# Patient Record
Sex: Male | Born: 1945 | Race: Black or African American | Hispanic: No | Marital: Married
Health system: Southern US, Community
[De-identification: ages and names within clinical notes are randomized; demographics above are authoritative.]

---

## 2013-09-24 ENCOUNTER — Ambulatory Visit: Payer: Self-pay | Admitting: Oncology

## 2013-10-18 ENCOUNTER — Inpatient Hospital Stay: Payer: Self-pay | Admitting: Internal Medicine

## 2013-10-18 ENCOUNTER — Ambulatory Visit: Payer: Self-pay | Admitting: Neurology

## 2013-10-18 LAB — URINALYSIS, COMPLETE
Leukocyte Esterase: NEGATIVE
Ph: 7 (ref 4.5–8.0)
RBC,UR: 11 /HPF (ref 0–5)
Specific Gravity: 1.019 (ref 1.003–1.030)
Squamous Epithelial: NONE SEEN
WBC UR: 1 /HPF (ref 0–5)

## 2013-10-18 LAB — COMPREHENSIVE METABOLIC PANEL
Albumin: 3.1 g/dL — ABNORMAL LOW (ref 3.4–5.0)
Alkaline Phosphatase: 52 U/L
BUN: 16 mg/dL (ref 7–18)
Chloride: 104 mmol/L (ref 98–107)
Co2: 25 mmol/L (ref 21–32)
Creatinine: 1.22 mg/dL (ref 0.60–1.30)
EGFR (African American): >60
EGFR (Non-African Amer.): >60
Osmolality: 272 (ref 275–301)
Potassium: 4 mmol/L (ref 3.5–5.1)
SGOT(AST): 25 U/L (ref 15–37)
SGPT (ALT): 20 U/L (ref 12–78)
Sodium: 134 mmol/L — ABNORMAL LOW (ref 136–145)
Total Protein: 8.4 g/dL — ABNORMAL HIGH (ref 6.4–8.2)

## 2013-10-18 LAB — CBC WITH DIFFERENTIAL/PLATELET
Basophil #: 0 10*3/uL (ref 0.0–0.1)
Eosinophil #: 0 10*3/uL (ref 0.0–0.7)
Eosinophil %: 0.4 %
HCT: 16.2 % — ABNORMAL LOW (ref 40.0–52.0)
Lymphocyte #: 1.4 10*3/uL (ref 1.0–3.6)
MCH: 37.6 pg — ABNORMAL HIGH (ref 26.0–34.0)
Monocyte #: 0.1 x10 3/mm — ABNORMAL LOW (ref 0.2–1.0)
Neutrophil #: 0.9 10*3/uL — ABNORMAL LOW (ref 1.4–6.5)
Platelet: 40 10*3/uL — ABNORMAL LOW (ref 150–440)

## 2013-10-18 LAB — FOLATE: Folic Acid: 16.8 ng/mL (ref 3.1–100.0)

## 2013-10-18 LAB — TROPONIN I: Troponin-I: <0.02 ng/mL

## 2013-10-19 LAB — CBC WITH DIFFERENTIAL/PLATELET
Basophil %: 0.6 %
Eosinophil #: 0 10*3/uL (ref 0.0–0.7)
Eosinophil %: 0.6 %
HCT: 19.7 % — ABNORMAL LOW (ref 40.0–52.0)
HGB: 6.9 g/dL — ABNORMAL LOW (ref 13.0–18.0)
Lymphocyte #: 1.1 10*3/uL (ref 1.0–3.6)
Lymphocytes: 52 %
MCH: 35.4 pg — ABNORMAL HIGH (ref 26.0–34.0)
MCHC: 34.9 g/dL (ref 32.0–36.0)
Metamyelocyte: 1 %
Monocyte %: 7.2 %
Monocytes: 7 %
Myelocyte: 1 %
NRBC/100 WBC: 2 /
Neutrophil %: 36 %
Other Cells Blood: 5
Platelet: 33 10*3/uL — ABNORMAL LOW (ref 150–440)
RDW: 21.6 % — ABNORMAL HIGH (ref 11.5–14.5)
WBC: 1.9 10*3/uL — CL (ref 3.8–10.6)

## 2013-10-19 LAB — BASIC METABOLIC PANEL
BUN: 13 mg/dL (ref 7–18)
Calcium, Total: 8.7 mg/dL (ref 8.5–10.1)
Co2: 28 mmol/L (ref 21–32)
Glucose: 110 mg/dL — ABNORMAL HIGH (ref 65–99)
Osmolality: 275 (ref 275–301)
Sodium: 137 mmol/L (ref 136–145)

## 2013-10-20 LAB — CBC WITH DIFFERENTIAL/PLATELET
Comment - H1-Com4: NORMAL
Eosinophil: 2 %
Lymphocytes: 70 %
Platelet: 35 10*3/uL — ABNORMAL LOW (ref 150–440)
RBC: 2.51 10*6/uL — ABNORMAL LOW (ref 4.40–5.90)
RDW: 23.8 % — ABNORMAL HIGH (ref 11.5–14.5)

## 2013-10-20 LAB — BASIC METABOLIC PANEL
Anion Gap: 4 — ABNORMAL LOW (ref 7–16)
BUN: 15 mg/dL (ref 7–18)
Calcium, Total: 8.6 mg/dL (ref 8.5–10.1)
Chloride: 101 mmol/L (ref 98–107)
Co2: 28 mmol/L (ref 21–32)
EGFR (African American): >60
EGFR (Non-African Amer.): 59 — ABNORMAL LOW
Osmolality: 268 (ref 275–301)
Sodium: 133 mmol/L — ABNORMAL LOW (ref 136–145)

## 2013-10-20 LAB — DIFFERENTIAL
Basophil #: 0 10*3/uL (ref 0.0–0.1)
Basophil %: 0.3 %
Eosinophil %: 0.3 %
Lymphocyte #: 1.6 10*3/uL (ref 1.0–3.6)
Lymphocyte %: 59.1 %
Monocyte #: 0.2 x10 3/mm (ref 0.2–1.0)
Neutrophil %: 33.9 %

## 2013-11-25 ENCOUNTER — Ambulatory Visit: Payer: Self-pay | Admitting: Oncology

## 2015-02-14 NOTE — Consult Note (Signed)
Preliminary results of BMBx reveal a highly malingant sample, suggesting acute leukemia. Patient should be transferred to a [facility] for further evaluation and treatment.  Patient is from [place] and may consider returning home prior to treatment.  Please page Dr. Orlie DakinFinnegan with any questions.    Electronic Signatures: Gerarda FractionFinnegan, Timothy (MD)  (Signed on 26-Dec-14 17:23)  Authored  Last Updated: 26-Dec-14 17:23 by Gerarda FractionFinnegan, Timothy (MD)

## 2015-02-14 NOTE — Consult Note (Signed)
History of Present Illness:  Reason for Consult Pancytopenia.   HPI   Patient is a 69 year old male who presented to the hospital complaining of acute onset dizziness.  He also is complaining of significant nausea.  During his workup, patient was noted to be severely pancytopenic.  Currently, his only complaint is of ear pain.  He denies any fevers, chills, or night sweats.  He has a good appetite and denies weight loss.  He denies any chest pain or shortness of breath.  He denies any easy bleeding or bruising.  He denies any nausea, vomiting, constipation, or diarrhea.  He has no melena or hematochezia.  He has no urinary complaints.  Patient otherwise feels well and offers no further specific complaints.  PFSH:  Additional Past Medical and Surgical History Hypertension, gout  Family history: Prostate cancer, diabetes, heart disease.  Social history: Former tobacco, quit in the 1990s.  Occasional alcohol.  Patient currently visiting from [place].   Review of Systems:  Performance Status (ECOG) 0   Review of Systems   As per HPI. Otherwise, 10 point system review was negative.   NURSING NOTES: **Vital Signs.:   26-Dec-14 05:48   Vital Signs Type: Routine   Temperature Temperature (F): 98.7   Celsius: 37   Temperature Source: oral   Pulse Pulse: 81   Respirations Respirations: 18   Systolic BP Systolic BP: 416   Diastolic BP (mmHg) Diastolic BP (mmHg): 72   Mean BP: 90   Pulse Ox % Pulse Ox %: 94   Pulse Ox Activity Level: At rest   Oxygen Delivery: Room Air/ 21 %   Physical Exam:  Physical Exam General: Well-developed, well-nourished, no acute distress. Eyes: Pink conjunctiva, anicteric sclera. HEENT: Normocephalic, moist mucous membranes, clear oropharnyx. Lungs: Clear to auscultation bilaterally. Heart: Regular rate and rhythm. No rubs, murmurs, or gallops. Abdomen: Soft, nontender, nondistended. No organomegaly noted, normoactive bowel  sounds. Musculoskeletal: No edema, cyanosis, or clubbing. Neuro: Alert, answering all questions appropriately. Cranial nerves grossly intact. Skin: No rashes or petechiae noted. Psych: Normal affect. Lymphatics: No cervical, calvicular, axillary or inguinal LAD.    No Known Allergies:     allopurinol 300 mg oral tablet: 1 tab(s) orally once a day, As Needed for gout, Status: Active, Quantity: 0, Refills: None   telmisartan 80 mg oral tablet: 1 tab(s) orally once a day, Status: Active, Quantity: 0, Refills: None   colchicine 0.6 mg oral tablet: 1 tab(s) orally once a day, As Needed for gout flare-ups, Status: Active, Quantity: 0, Refills: None  Laboratory Results: Routine Chem:  26-Dec-14 05:53   Result Comment WBC - RESULTS VERIFIED BY REPEAT TESTING.  - NOTIFIED OF CRITICAL VALUE  - C/ VERNICIA GRAVES $RemoveBeforeD'@[id number]'NyELVjSEyyRxRv$  10-19-13. AJO  - READ-BACK PROCESS PERFORMED.  Result(s) reported on 19 Oct 2013 at 07:51AM.  Glucose, Serum  110  BUN 13  Creatinine (comp)  1.31  Sodium, Serum 137  Potassium, Serum 3.8  Chloride, Serum 105  CO2, Serum 28  Calcium (Total), Serum 8.7  Anion Gap  4  Osmolality (calc) 275  eGFR (African American) >60  eGFR (Non-African American)  57 (eGFR values <63mL/min/1.73 m2 may be an indication of chronic kidney disease (CKD). Calculated eGFR is useful in patients with stable renal function. The eGFR calculation will not be reliable in acutely ill patients when serum creatinine is changing rapidly. It is not useful in  patients on dialysis. The eGFR calculation may not be applicable to patients at the low and high  extremes of body sizes, pregnant women, and vegetarians.)  Routine Hem:  26-Dec-14 05:53   WBC (CBC)  1.9  RBC (CBC)  1.94  Hemoglobin (CBC)  6.9  Hematocrit (CBC)  19.7  Platelet Count (CBC)  33  MCV  101  MCH  35.4  MCHC 34.9  RDW  21.6  Neutrophil % 36.0  Lymphocyte % 55.6  Monocyte % 7.2  Eosinophil % 0.6  Basophil % 0.6  Neutrophil #   0.7  Lymphocyte # 1.1  Monocyte #  0.1  Eosinophil # 0.0  Basophil # 0.0   Assessment and Plan: Impression:   Pancytopenia. Plan:   1.  Pancytopenia: Unclear etiology, but given his neutropenia, severe anemia without bleeding, and thrombocytopenia this is concerning for an underlying bone marrow disorder.  Patient will have a bone marrow biopsy later today.  Typically takes 48-72 hours for results.  Continue to transfuse and maintain hemoglobin above 7.0.  He does not require platelet transfusion at this time, but would consider one if he falls below 20 or develops symptoms of bleeding.  Do not give Neulasta or Neupogen since these may actually worsen his underlying problem. Ear pain: Likely unrelated to his pancytopenia. consult, will follow.  Electronic Signatures: Delight Hoh (MD)  (Signed 26-Dec-14 11:07)  Authored: HISTORY OF PRESENT ILLNESS, PFSH, ROS, NURSING NOTES, PE, ALLERGIES, HOME MEDICATIONS, LABS, ASSESSMENT AND PLAN   Last Updated: 26-Dec-14 11:07 by Delight Hoh (MD)

## 2015-02-14 NOTE — Consult Note (Signed)
PATIENT NAME:  Philip Ibarra, Cope MR#:  [id number] DATE OF BIRTH:  10-Jun-1946  DATE OF CONSULTATION:  10/19/2013  REFERRING PHYSICIAN:  Dr. Cherlynn KaiserSainani CONSULTING PHYSICIAN:  Zackery BarefootJ. Madison Darvin Dials, MD  HISTORY OF PRESENT ILLNESS:  The patient is a 69 year old African American male who was traveling from [place] and presented to the Emergency Room yesterday. He was admitted with worsening dizziness. Notably, he was recently worked up for cardiac event after having a tooth pulled. He was referred to a pulmonologist for shortness of breath. Notably he underwent left ear surgery, but does not know the details other than "they pulled wax out." He will try to obtain details of that operation for us.   ALLERGIES:  None.   MEDICATIONS: 1.  Allopurinol. 2.  Colchicine.  3.  Telmisartan.   PAST MEDICAL HISTORY: 1.  Left ear surgery.  2.  Dental surgery.  3.  Recent cardiac catheterization.  4.  Hypertension.  5.  Gout.   PHYSICAL EXAMINATION:  GENERAL:  African American male in no acute distress. Cooperative.  EYES:  Pupils equal, round, reactive to light. Extraocular movements are intact. 3-beat nystagmus with rightward version of the eyes.  ORAL CAVITY AND OROPHARYNX:  No masses or lesions.  EARS:  The external auditory canals are clear. There is a yellow fullness in the tympanic membrane inferiorly on the left. It appears to be consistent with fluid or a fat graft.  NECK:  Trachea is midline. There are no masses or lesions.   IMPRESSION:  Dizziness, likely secondary to multifactorial issues, not consistent with exclusively peripheral vestibular dysfunction, although this likely contributes.   PLAN:  I have recommended the patient help arrange for details of his operation to be provided for us. I doubt that he was taken to surgery for cerumen impaction, and the ear was likely repaired with a fat graft for a tympanic membrane perforation, but I an unsure as to whether or not he had a cholesteatoma. The  patient does not recognize the word cholesteatoma. I have recommended that the patient continue his workup either here or at home with the pulmonologist and continued close followup with the ENT doctor back in [place]. I have recommended that the patient use nasal steroids to help open his eustachian tube as well as oral antibiotics as an outpatient.  ____________________________ Shela CommonsJ. Gertie BaronMadison Aayliah Rotenberry, MD jmc:jm D: 10/19/2013 14:15:16 ET T: 10/19/2013 14:38:38 ET JOB#: [id number]  cc: Zackery BarefootJ. Madison Mizuki Hoel, MD, <Dictator> [facility] - Practice Administrator Wendee CoppJMADISON Juanetta Negash MD ELECTRONICALLY SIGNED 10/20/2013 18:54

## 2015-02-15 NOTE — H&P (Signed)
PATIENT NAME:  Philip Ibarra, Philip Ibarra MR#:  [id number] DATE OF BIRTH:  1946-01-17  DATE OF ADMISSION:  10/18/2013  PRIMARY CARE PHYSICIAN: Located in [place].   CHIEF COMPLAINT: Dizziness.   HISTORY OF PRESENT ILLNESS: This is a 69 year old male who presents to the hospital due to acute dizziness that began this morning. The patient said that he had a tooth pulled a few weeks back in [place] and was started on antibiotics after the tooth being pulled. He then became somewhat short of breath and had a full cardiologic evaluation including a catheterization in [place]. He was then referred to a pulmonologist but has not seen one yet due to his shortness of breath. He is visiting from [place], and this morning he was increasingly dizzy and became quite nauseated and thought he had to vomit. He came to the ER for further evaluation.   The patient on workup was noted to be severely pancytopenic. Hospitalist services were contacted for further treatment and evaluation.   The patient denies any chest pain. Positive nausea. No vomiting. Denies any headache, any easy bruising or bleeding, any abdominal pain or any other associated symptoms presently.   REVIEW OF SYSTEMS: CONSTITUTIONAL: No documented fever. Positive weakness. No weight gain, no weight loss.  EYES: No blurred or double vision.  ENT: No tinnitus. No postnasal drip but no redness of the oropharynx. Positive vertigo.  RESPIRATORY: No cough. No wheeze. No hemoptysis. Positive dyspnea.  CARDIOVASCULAR: No chest pain. No orthopnea. No palpitations. No syncope.  GASTROINTESTINAL: Positive nausea. No vomiting. No diarrhea. No abdominal pain. No melena or hematochezia.  GENITOURINARY: No dysuria or hematuria.  ENDOCRINE: No polyuria, nocturia, heat or cold intolerance.  HEMATOLOGIC: Positive anemia. No acute bruising or bleeding.  INTEGUMENTARY: No rashes. No lesions.  MUSCULOSKELETAL: No arthritis. No swelling. No gout.  NEUROLOGIC: No numbness or  tingling. No ataxia. No seizure-type activity.  PSYCHIATRIC: No anxiety. No insomnia. No ADD.   PAST MEDICAL HISTORY: Consistent with hypertension and gout.   ALLERGIES: No known drug allergies.   SOCIAL HISTORY: Used to be a smoker, quit in 1990s. Occasional alcohol use. No illicit drug abuse. Lives at home with his wife.   FAMILY HISTORY: Both mother and father are deceased. Father died from prostate cancer. Mother had diabetes and heart disease.   CURRENT MEDICATIONS: Allopurinol 300 mg daily as needed, colchicine 0.6 mg daily as needed for gout attack, and telmisartan 80 mg daily.   PHYSICAL EXAMINATION:  VITAL SIGNS: Temperature 98.5, pulse 75, respirations 16, blood pressure 123/71, sats 98% on room air.  GENERAL: A pleasant-appearing male in no apparent distress.  HEENT: Atraumatic, normocephalic. Extraocular muscles are intact. Pupils equal and reactive to light. Sclerae anicteric. No conjunctival injection. No pharyngeal erythema.  NECK: Supple. There is no jugular venous distention. No bruits. No lymphadenopathy or thyromegaly.  HEART: Regular rate and rhythm. No murmurs. No rubs. No clicks.  LUNGS: Clear to auscultation bilaterally. No rales or rhonchi. No wheezes.  ABDOMEN: Soft, flat, nontender, nondistended. Has good bowel sounds. No hepatosplenomegaly appreciated.  EXTREMITIES: No evidence of any cyanosis, clubbing, or peripheral edema. Has +2 pedal and radial pulses bilaterally.  NEUROLOGICAL: Alert, awake, and oriented x3 with no focal motor or sensory deficits appreciated bilaterally.  SKIN: Moist and warm with no rashes appreciated. In fact, there is no cervical or axillary lymphadenopathy.   LABORATORY DATA: Serum glucose of 141, BUN 16, creatinine 1.2, sodium 134, potassium 4, chloride 104, bicarb 25. LFTs within normal limits. Troponin less than  0.02. White cell count of 2.4. Hemoglobin 5.5, hematocrit 16.2, platelet count 40,000. MCV is 110. RDW 18.4. Urinalysis is  within normal limits.   The patient did have a CT of the head done without contrast which showed decreased attenuation in the periphery of the right temporal lobe compared to the left. While this area of the brain,  he is prone to artifactual changes of this nature on the CT. The possibility of a recent infarct in this area cannot be excluded on the study. No other findings concerning for potential acute infarct is identified. There is age-related volume loss. No hemorrhage or mass effect.   ASSESSMENT AND PLAN: This is a 69 year old male with a history of hypertension, gout, who presents to the hospital with dizziness and vertigo and noted to be pancytopenic.   1.  Acute pancytopenia. The exact etiology of this is unclear. The patient has no history of acute bleeding or bruising, no history of previous anemia, is currently not on any anticoagulants. For now, we will continued supportive care with giving him 2 units of packed red blood cells. Follow serial hemoglobin and serial counts. He does not need any platelet transfusion at this time. I will get a hematology/oncology consult, discuss the case with Dr. Grayland Ormond. The patient likely would benefit from a bone marrow biopsy.  2.  Dizziness/vertigo. The exact etiology of this is also unclear. Questionable if this is related to anemia versus primary issue. On exam, the patient does have a left inner ear cystic lesion. I will continue supportive care and give him some meclizine and transfuse him 2 units of packed red blood cells and follow him clinically. I will also get an ENT consult.  3.  Hypertension. I will continue his telmisartan. 4.  Gout. The patient had no acute attack. I will continue his allopurinol and colchicine as needed.   CODE STATUS: FULL CODE.   TIME SPENT: 50 minutes.   ____________________________ Belia Heman. Verdell Carmine, MD vjs:np D: 10/18/2013 16:27:43 ET T: 10/18/2013 17:15:45 ET JOB#: [id number]  cc: Belia Heman. Verdell Carmine, MD,  <Dictator> Henreitta Leber MD ELECTRONICALLY SIGNED 11/21/2013 11:33

## 2015-02-15 NOTE — Discharge Summary (Signed)
PATIENT NAME:  Philip Ibarra, Philip Ibarra MR#:  [id number] DATE OF BIRTH:  [birth year]  DATE OF ADMISSION:  10/18/2013 DATE OF DISCHARGE:  10/20/2013  ADMITTING DIAGNOSIS:  Pancytopenia.  DISCHARGE DIAGNOSES:  1.  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  2.  Dyspnea, suspect sinusitis.  3.  History of hypertension. 4.  Gout.   DISCHARGE CONDITION: Stable.   DISCHARGE MEDICATIONS: The patient is to continue outpatient medications which are;   1.  Allopurinol 300 mg p.o. as needed 1 daily.  2.  Telmisartan 80 mg p.o. daily. 3.  Colchicine 0.6 mg p.o. daily as needed.  4.  Meclizine 25 mg p.o. 3 times daily as needed.  5.  Levaquin 500 mg p.o. daily for 10 more days.  6.  Combivent 100/20, 1 puff 4 times daily as needed.  7.  Home oxygen: None.   DIET: 2 grams salt, regular consistency.   ACTIVITY LIMITATIONS: As tolerated.   FOLLOWUP APPOINTMENT: With oncology as soon as possible in the next 1 to 2 days after discharge.    PATIENT'S PRIMARY CARE PHYSICIAN:  Unfortunately unknown.   CONSULTANTS:  Dr. Irish Elders, Dr. Carlis Abbott, Dr. Grayland Ormond, Dr. Inez Pilgrim.   RADIOLOGIC STUDIES: CT of head without contrast, 25h of December,  showing decrease attenuation in the peripheral of the right temporal lobe compared to the left while this area of the brain is prone to artifactual changes of the brain of this nature on CT. The possibility of recent infarct in this area cannot be excluded on this study. No other findings concerning for potential acute infarct was identified. There was also age-related volume loss. There was no hemorrhage or mass effect. 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 There is also left tympanic membrane thickening as well as atelectasis in this region on the left according to radiologist.   HISTORY OF PRESENT ILLNESS:  The patient is a 69 year old African-American male with past medical history significant for history of recent tooth problems who has had been on  antibiotic therapy up until recently, presented to the hospital with dizziness. Please refer to Dr. Edward Jolly admission note on the 25th of December, 2014. Apparently, the patient started having problems with acute dizziness which started on day of admission.  He also was complaining of shortness of breath. He already reported full  cardiologic evaluation, including catheterization in [place] recently, as well as antibiotic therapy for the tooth recently.  On arrival to the hospital, he was dizzy and nauseated and was found to be severely pancytopenic and was admitted by hospitalist for further evaluation. On arrival to the Emergency Room, the patient's temperature was 98.5, pulse was 75, respiration rate was 16, blood pressure 123/60, pulse is 71, O2 sat was s 98% on room air.   PHYSICAL EXAM: Was unremarkable.   LAB DATA: Revealed normal BMP, except for glucose 141, sodium 134, otherwise BMP was unremarkable. The patient's liver enzymes revealed elevation of total protein of 8.4, albumin level of 3.1, otherwise liver enzymes were normal. Troponin was less than 0.02. Blood cell count was low at 2.4, hemoglobin was 5.5 and platelet count was 40. MCV was high at 110, actual neutrophil count was low at 0.9. Differential was checked and was found to have 6 bands, 25% segmented neutrophils 52% lymphocytes, 2% of variant lymphocytes, 7 monocytes, 1 eosinophils, 1 metamyelocyte, 1 myelocyte, other cells were 5% and RBCs were 2%. Anisocytosis, as well as poikilocytosis, polychromasia and macrocytosis was present as well as toxic vacuolization.  Platelets were found to be variable in size.   HOSPITAL COURSE:  The patient was admitted to the hospital for further evaluation. Because of severe anemia, he was transfused 3 units of packed red blood cells after which hemoglobin level improved to 8.3 on the 27th of December, 2014. He platelet count remained stable at 35 and his white blood cell count also improved to 2.6.  The patient underwent bone marrow biopsy 26th of December 2014 which revealed a highly malignant samples suggesting acute leukemia. The patient was evaluated by oncologist, Dr. Grayland Ormond and Dr. Inez Pilgrim, who felt that the patient should be transferred to tertiary care center for further evaluation and treatment. Since patient was from [place], that was discussed with patient's family and they were eager to return back home to [place] and sign in to likely [facility]. The patient will be given a bone marrow biopsy samples to take with himself.  Since patient was neutropenic and was having mild elevation of temperature up to 99.5, he was given 2 grams of Cefepime on the 27th of December 2014. He was also asked to initiate antibiotic therapy with Levaquin tomorrow on the 28th of December, 2014, if for some reason he is not seen by oncologist earlier. On the day of discharge, 27th of December, 2014, his temperature is 99.5, pulse is 86, respiration rate was 18, blood pressure 372B to 021J systolic and 15Z and 20E diastolic, O2 sats were 02% on room air at rest. It was felt that patient's fever could have been related to sinusitis versus mild bronchitis, as the patient was having some cough and sinus drainage. He denies any dizziness. The patient is to continue meclizine. He was seen by Dr. Carlis Abbott, ENT physician, while he was in the hospital. It was felt that the patient's workup should include ENT physician back in [place]. It was recommended to continue nasal steroids, but since the patient was pancytopenic, we did not he is a good candidate to continue nasal steroids, however, Dr. Carlis Abbott felt that the patient nasal steroids would help him to open his eustachian tube. Dr. Carlis Abbott also recommended antibiotic therapy for this as well.  He was also seen by Dr. Irish Elders, neurologist, for dizziness. It was felt that it could have been benign positional vertigo or vestibulitis tinnitus, but less  likely Meniere's disease. He did not recommend having no further neurologic testing. For pancytopenia, as mentioned above, the patient was seen by oncologist and underwent bone marrow biopsy on 26th of December, 2014 which revealed a highly abnormal bone marrow concerning for acute leukemia. The patient was seen by Dr. Grayland Ormond as well as Dr. Inez Pilgrim who recommended transfer to tertiary care center as soon as possible and this patient's family requested discharge. The patient is being discharged home after he is given one dose of IV antibiotics prior to leaving the hospital.  On the day of discharge, temperature, as mentioned above, is 99.5, pulse was 86, respiratory rate was 18, blood pressure 154/80, saturation was 95% on room air at rest. For patient's chronic medical problems including hypertension as well as gout, the patient is to continue his outpatient management.   TIME SPENT: 40 minutes with the patient.   ____________________________ Theodoro Grist, MD rv:NTS D: 10/20/2013 18:28:35 ET T: 10/21/2013 05:54:33 ET JOB#: [id number]  cc: Theodoro Grist, MD, <Dictator> PRIMARY CARE PHYSICIAN  Jenaya Saar MD ELECTRONICALLY SIGNED 11/05/2013 14:31

## 2015-02-15 NOTE — Consult Note (Signed)
PATIENT NAME:  Philip Ibarra, Philip Ibarra MR#:  [id number] DATE OF BIRTH:  1946/07/10  DATE OF CONSULTATION:  10/18/2013  REFERRING PHYSICIAN:   CONSULTING PHYSICIAN:  Pauletta Browns, MD    REASON FOR CONSULTATION:  Rule out stroke.   HISTORY OF PRESENT ILLNESS:  This is a 69 year old male, who presents to the hospital with dizziness that began this morning. The patient is visiting from [place], states that he had increased episodes of dizziness associated with nausea and vomiting. Dizziness is associated with change of position, specifically moving from one side to the other. No chest pain. No headaches. This current dizziness is associated with about 2 to 3-week history of decreased hearing in the left ear.   REVIEW OF SYSTEMS:  CONSTITUTIONAL:  Generalized weakness, dizziness.  EYES:  No blurred vision.  ENT:  Positive tinnitus.  RESPIRATORY:  No cough. No wheezing.  CARDIOVASCULAR:  No chest pain.  GASTROINTESTINAL:  Positive for nausea.  GENITOURINARY:  No dysuria or hematuria. PSYCHIATRIC:  No anxiety. No depression.   PAST MEDICAL HISTORY:  Hypertension and gout.   ALLERGIES:  No known drug allergies.   SOCIAL HISTORY:  He used to be a smoker, quit in the '90s, occasional social use. No illicit drug use.   FAMILY HISTORY:  Both mother and father are deceased.   MEDICATIONS:  Include early allopurinol, colchicine, telmisartan.   BLOOD WORK:  Includes a glucose of 141, BUN 16, creatinine 1.22, sodium 134, potassium 4.0, chloride 104. White blood cell count is 2.4, hemoglobin is 5.5, platelet count is 40.   CT of the head done, which showed question of decreased attenuation, right temporal lobe, which I think is an artifact.   VITAL SIGNS:  Include a temperature of 97.5, pulse 68, respirations 16, blood pressure 104/57, pulse ox 98%, saturating well on room air.   PHYSICAL EXAMINATION:  The patient is alert, awake, oriented to place, time, location, and the reason he is in the hospital.  Extraocular movements intact. Pupils 4 mm to 3 mm, reactive bilaterally. Facial sensation intact. Tongue is midline. Uvula elevates symmetrically. Shoulder shrug intact. Motor strength 5/5 bilateral upper and lower extremities. Positive tinnitus and decreased hearing in the left ear, which is 2 to 3 weeks. Sensation is symmetrical throughout. Reflexes 1+ throughout. Coordination intact. Gait not assessed.   IMPRESSION:  A 68 year old male with a past medical history of hypertension and gout presented to hospital with dizziness and vertigo. On workup, the patient had a CAT scan of the head, which I believe has an incidental finding of questionable hypertensive right temporal lobe, which I believe is an artifact in nature. The patient was found to be pancytopenic with a hemoglobin 5.5, white blood cell count 2.4, platelet count of 40,000, elevated MCV of 110, RDW of 18.4. No focal neurological deficits at this time.   PLAN:   1.  I agree with evaluation by hematology/oncology for pancytopenia.  2.  Dizziness and vertigo. I think this is possibly related to either benign positional vertigo, as the patient has symptoms with change in positions, or vestibulitis due to tinnitus, less likely Meniere's disease. I agree with possibility of ENT consultation. The patient's current dizziness could be also contributing to his anemia. At this point, I would not recommend any further neurological testing.   This case was discussed with the patient and the patient's family at his bedside. Thank you. It was a pleasure seeing this patient.    ____________________________ Pauletta Browns, MD yz:ms D: 10/18/2013 20:13:48 ET  T: 10/18/2013 20:40:16 ET JOB#: [id number]  cc: Pauletta BrownsYuriy Jakeem Grape, MD, <Dictator> Pauletta BrownsYURIY Maevyn Riordan MD ELECTRONICALLY SIGNED 10/26/2013 14:50

## 2015-07-26 DEATH — deceased

## 2015-08-02 IMAGING — CT CT HEAD WITHOUT CONTRAST
1 series · 15 of 30 positions shown, 19 images · non-contrast
Comparison: None.

CLINICAL DATA: Dizziness with left ear pain; nausea and vomiting

EXAM:
CT HEAD WITHOUT CONTRAST
TECHNIQUE: Contiguous axial images were obtained from the base of the skull
through the vertex without intravenous contrast. Study was obtained
within 24 hr of patient's arrival at the emergency department.

[Series 2: head wo · axial · 0.42mm/px · z∈[-103,+41]mm · 15 of 36 slices shown, 19 images]
[im 2/36  brain]
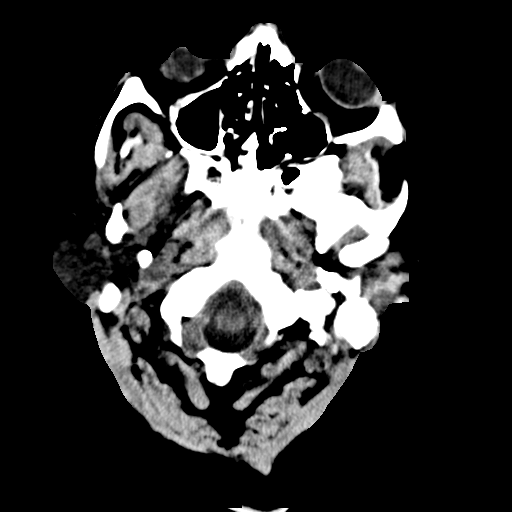
[im 2/36  bone]
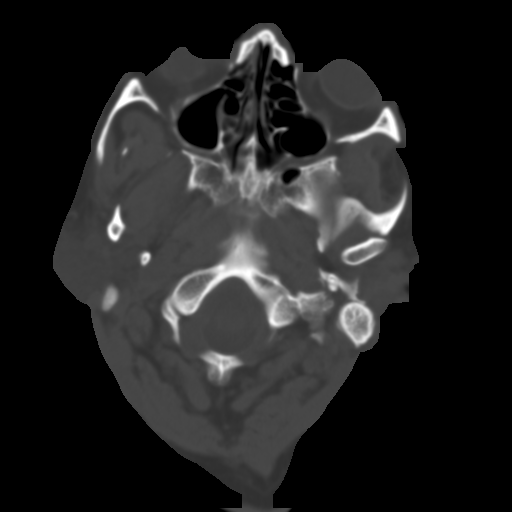
[im 4/36  brain]
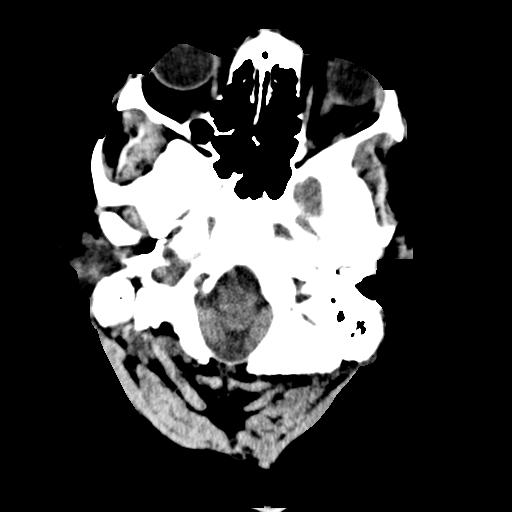
[im 7/36  brain]
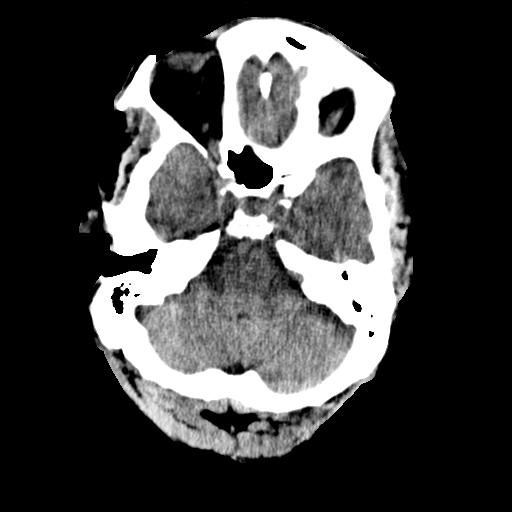
[im 9/36  brain]
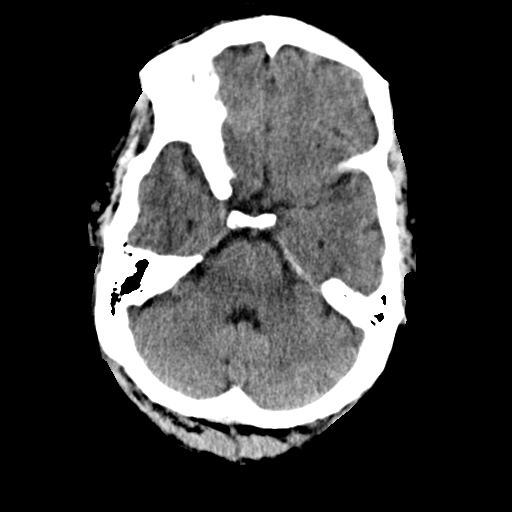
[im 11/36  brain]
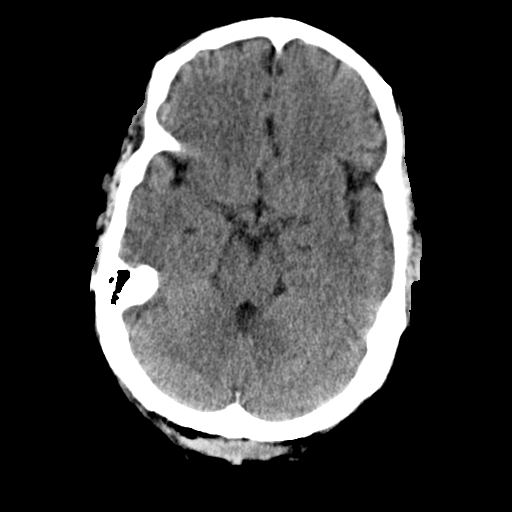
[im 11/36  bone]
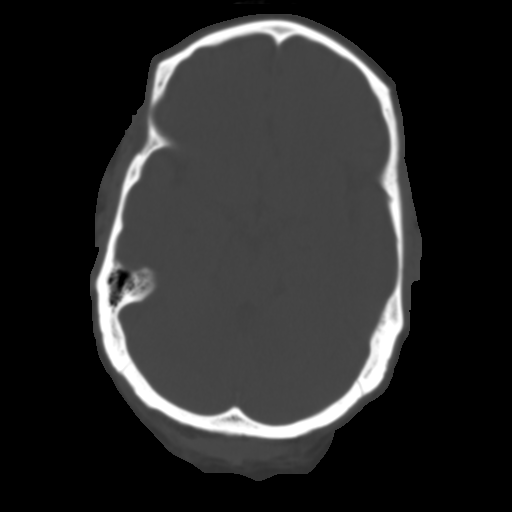
[im 14/36  brain]
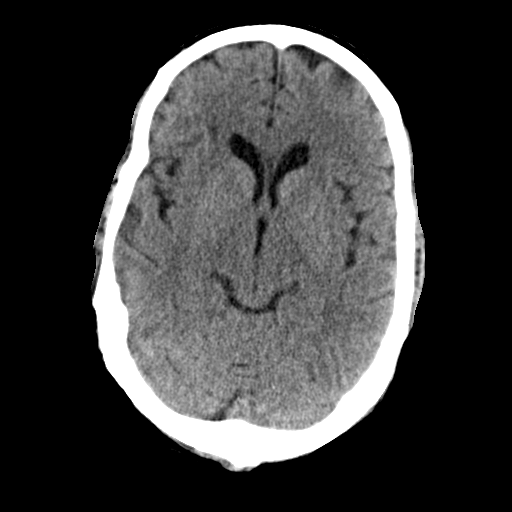
[im 16/36  brain]
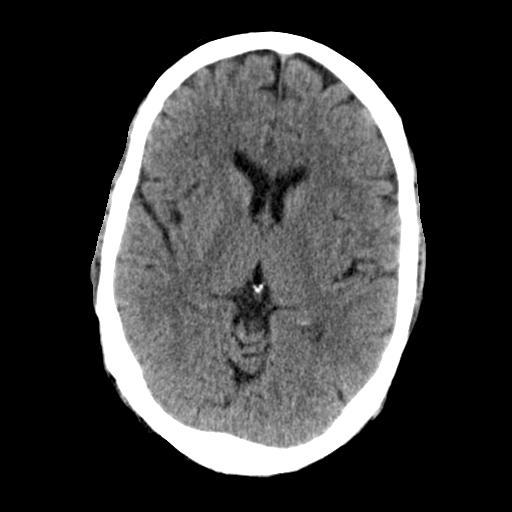
[im 19/36  brain]
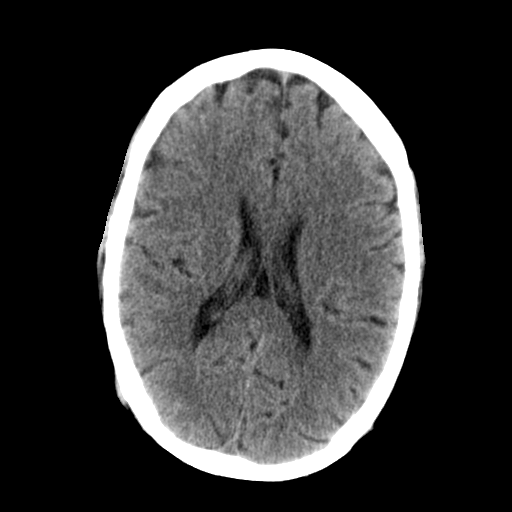
[im 20/36  brain]
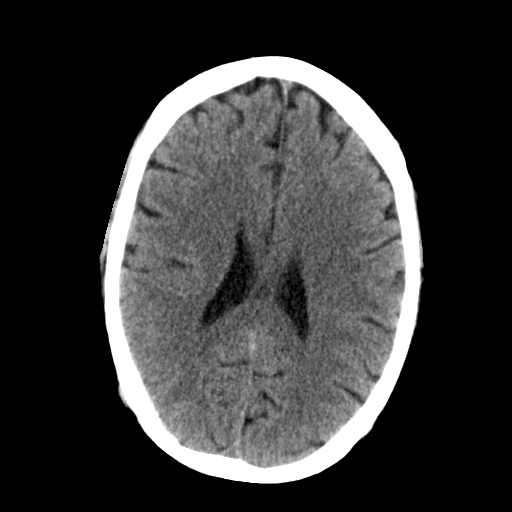
[im 20/36  bone]
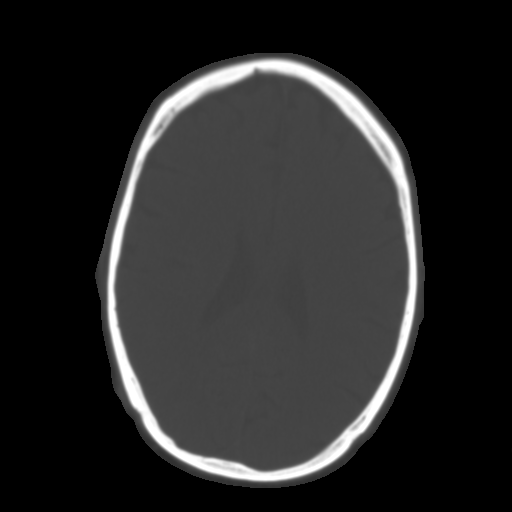
[im 22/36  brain]
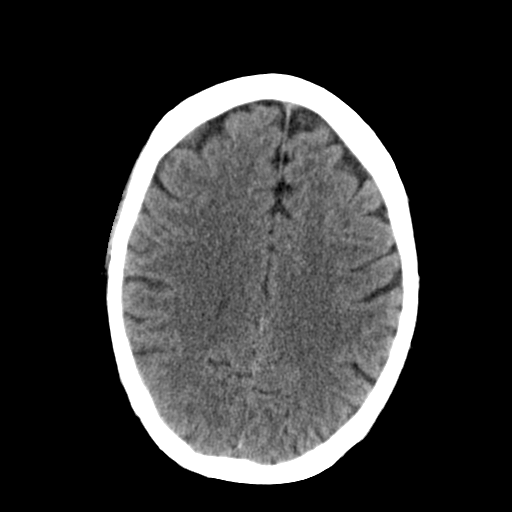
[im 25/36  brain]
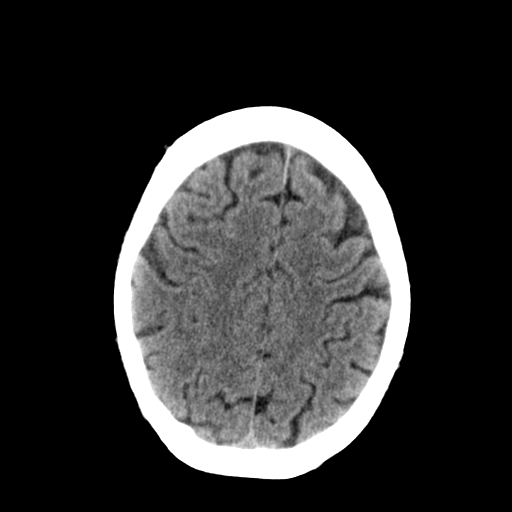
[im 27/36  brain]
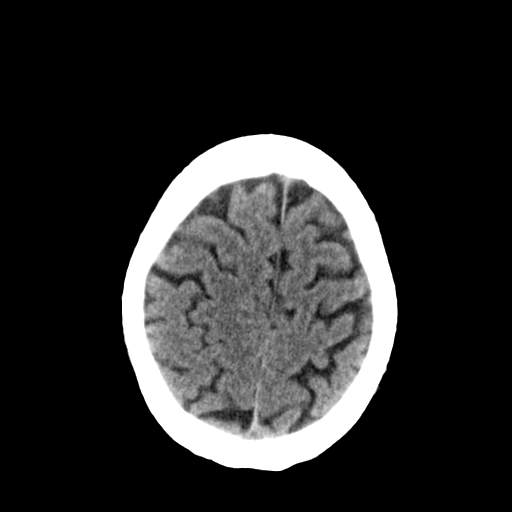
[im 29/36  brain]
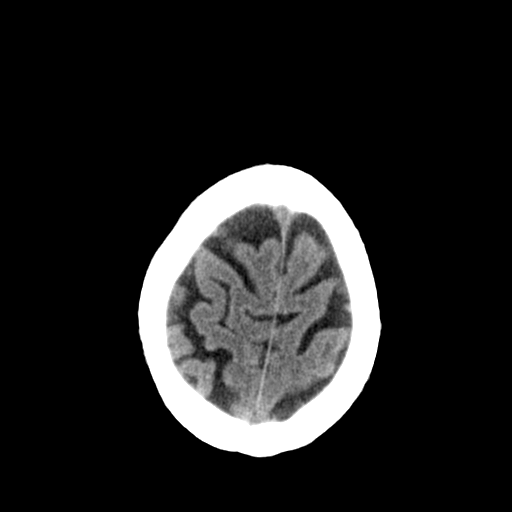
[im 29/36  bone]
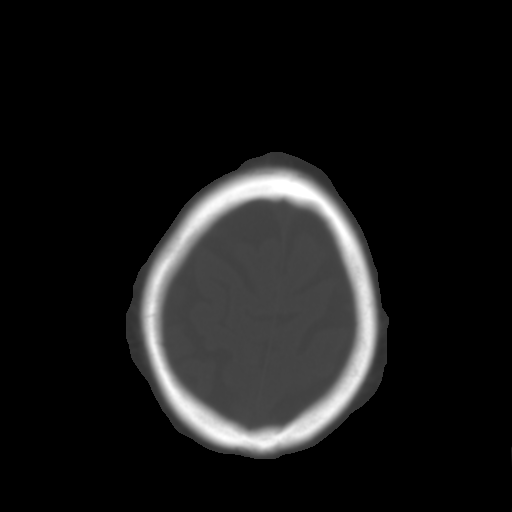
[im 32/36  brain]
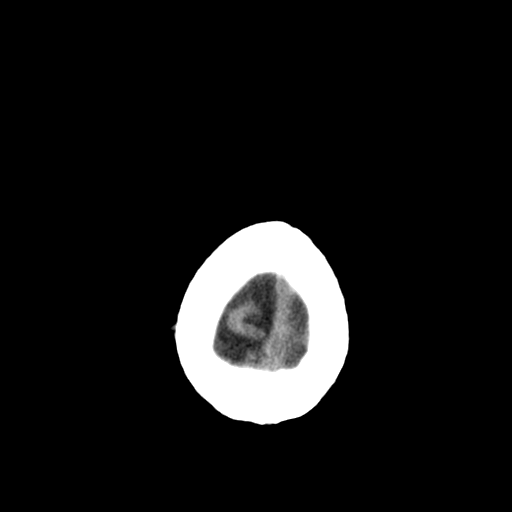
[im 34/36  brain]
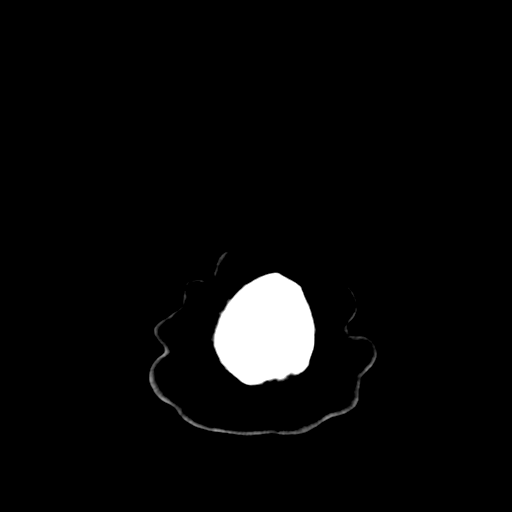

[15 of 30 positions shown; findings below may reference images not displayed]

FINDINGS: There is age related volume loss. There is no mass, hemorrhage,
extra-axial fluid collection, or midline shift.

There is decreased attenuation in the periphery of the right
temporal lobe compared to the left side. This asymmetry raises
possibility of recent infarct in this area. No, however, that the
sulci do not appear appreciably effaced in this area.

No other areas concerning for potential acute infarct identified.

The bony calvarium appears intact. There is mastoid disease with
opacification of multiple mastoids on the left. There are no
air-fluid levels. Mastoids on the right are clear. There is no
opacification the external auditory canals on either side. There
does appear to be some atelectatic change in the region of the left
middle ear with thickening of the left tympanic membrane.
IMPRESSION: There is decreased attenuation in the periphery of the right
temporal lobe compared to the left. While this area of the brain he
is prone to artifactual changes of this nature on CT, the
possibility of recent infarct in this area cannot be excluded on
this study. No other findings concerning for potential acute infarct
identified.

There is age related volume loss. There is no hemorrhage or mass
effect.

There is mastoid disease on the left. There is also left tympanic
membrane thickening and atelectasis in this region on the left.
